# Patient Record
Sex: Female | Born: 1975 | Race: White | Marital: Single | Smoking: Current every day smoker
Health system: Southern US, Community
[De-identification: ages and names within clinical notes are randomized; demographics above are authoritative.]

## PROBLEM LIST (undated history)

## (undated) DIAGNOSIS — K2100 Gastro-esophageal reflux disease with esophagitis, without bleeding: Secondary | ICD-10-CM

## (undated) DIAGNOSIS — B354 Tinea corporis: Secondary | ICD-10-CM

## (undated) DIAGNOSIS — F32A Depression, unspecified: Secondary | ICD-10-CM

## (undated) DIAGNOSIS — D473 Essential (hemorrhagic) thrombocythemia: Secondary | ICD-10-CM

## (undated) DIAGNOSIS — F329 Major depressive disorder, single episode, unspecified: Secondary | ICD-10-CM

## (undated) DIAGNOSIS — D75839 Thrombocytosis, unspecified: Secondary | ICD-10-CM

## (undated) DIAGNOSIS — J189 Pneumonia, unspecified organism: Secondary | ICD-10-CM

## (undated) DIAGNOSIS — K449 Diaphragmatic hernia without obstruction or gangrene: Secondary | ICD-10-CM

## (undated) DIAGNOSIS — K589 Irritable bowel syndrome without diarrhea: Secondary | ICD-10-CM

## (undated) DIAGNOSIS — R51 Headache: Secondary | ICD-10-CM

## (undated) HISTORY — DX: Tinea corporis: B35.4

## (undated) HISTORY — DX: Depression, unspecified: F32.A

## (undated) HISTORY — DX: Gastro-esophageal reflux disease with esophagitis, without bleeding: K21.00

## (undated) HISTORY — DX: Major depressive disorder, single episode, unspecified: F32.9

## (undated) HISTORY — DX: Essential (hemorrhagic) thrombocythemia: D47.3

## (undated) HISTORY — DX: Gastro-esophageal reflux disease with esophagitis: K21.0

## (undated) HISTORY — DX: Thrombocytosis, unspecified: D75.839

## (undated) HISTORY — DX: Irritable bowel syndrome without diarrhea: K58.9

---

## 2010-11-13 ENCOUNTER — Other Ambulatory Visit: Payer: Self-pay | Admitting: Oncology

## 2010-11-13 ENCOUNTER — Encounter (HOSPITAL_BASED_OUTPATIENT_CLINIC_OR_DEPARTMENT_OTHER): Payer: BC Managed Care – PPO | Admitting: Oncology

## 2010-11-13 DIAGNOSIS — Z862 Personal history of diseases of the blood and blood-forming organs and certain disorders involving the immune mechanism: Secondary | ICD-10-CM

## 2010-11-13 DIAGNOSIS — D473 Essential (hemorrhagic) thrombocythemia: Secondary | ICD-10-CM

## 2010-11-13 LAB — COMPREHENSIVE METABOLIC PANEL
Albumin: 4.3 g/dL (ref 3.5–5.2)
BUN: 13 mg/dL (ref 6–23)
Calcium: 9.7 mg/dL (ref 8.4–10.5)
Chloride: 101 mEq/L (ref 96–112)
Glucose, Bld: 85 mg/dL (ref 70–99)
Potassium: 4 mEq/L (ref 3.5–5.3)

## 2010-11-13 LAB — CBC WITH DIFFERENTIAL/PLATELET
BASO%: 0.2 % (ref 0.0–2.0)
EOS%: 3.5 % (ref 0.0–7.0)
HCT: 42.5 % (ref 34.8–46.6)
LYMPH%: 26.8 % (ref 14.0–49.7)
MCH: 34 pg (ref 25.1–34.0)
MCHC: 34.4 g/dL (ref 31.5–36.0)
NEUT%: 63.3 % (ref 38.4–76.8)
RBC: 4.29 10*6/uL (ref 3.70–5.45)
lymph#: 2.5 10*3/uL (ref 0.9–3.3)

## 2010-11-13 LAB — MORPHOLOGY

## 2010-11-14 ENCOUNTER — Other Ambulatory Visit: Payer: Self-pay | Admitting: Oncology

## 2010-11-14 DIAGNOSIS — D473 Essential (hemorrhagic) thrombocythemia: Secondary | ICD-10-CM

## 2010-11-14 DIAGNOSIS — D75839 Thrombocytosis, unspecified: Secondary | ICD-10-CM

## 2010-11-17 LAB — FERRITIN: Ferritin: 576 ng/mL — ABNORMAL HIGH (ref 10–291)

## 2010-11-17 LAB — IRON AND TIBC
%SAT: 32 % (ref 20–55)
Iron: 84 ug/dL (ref 42–145)
TIBC: 266 ug/dL (ref 250–470)

## 2010-11-17 LAB — BCR/ABL (LIO MMD)

## 2010-12-12 ENCOUNTER — Other Ambulatory Visit: Payer: Self-pay | Admitting: Oncology

## 2010-12-12 ENCOUNTER — Other Ambulatory Visit (HOSPITAL_BASED_OUTPATIENT_CLINIC_OR_DEPARTMENT_OTHER): Payer: BC Managed Care – PPO

## 2010-12-12 ENCOUNTER — Other Ambulatory Visit (HOSPITAL_COMMUNITY): Payer: Self-pay | Admitting: Oncology

## 2010-12-12 DIAGNOSIS — D75839 Thrombocytosis, unspecified: Secondary | ICD-10-CM

## 2010-12-12 DIAGNOSIS — D473 Essential (hemorrhagic) thrombocythemia: Secondary | ICD-10-CM

## 2010-12-12 LAB — CBC WITH DIFFERENTIAL/PLATELET
BASO%: 0.3 % (ref 0.0–2.0)
EOS%: 3.2 % (ref 0.0–7.0)
Eosinophils Absolute: 0.4 10*3/uL (ref 0.0–0.5)
LYMPH%: 19.1 % (ref 14.0–49.7)
MCHC: 33.9 g/dL (ref 31.5–36.0)
MCV: 97.7 fL (ref 79.5–101.0)
MONO%: 4.8 % (ref 0.0–14.0)
NEUT#: 8.5 10*3/uL — ABNORMAL HIGH (ref 1.5–6.5)
Platelets: 600 10*3/uL — ABNORMAL HIGH (ref 145–400)
RBC: 4.31 10*6/uL (ref 3.70–5.45)
RDW: 12.3 % (ref 11.2–14.5)

## 2010-12-13 ENCOUNTER — Encounter: Payer: Self-pay | Admitting: Oncology

## 2010-12-13 ENCOUNTER — Other Ambulatory Visit: Payer: Self-pay | Admitting: Oncology

## 2010-12-13 DIAGNOSIS — D473 Essential (hemorrhagic) thrombocythemia: Secondary | ICD-10-CM

## 2010-12-13 DIAGNOSIS — D72829 Elevated white blood cell count, unspecified: Secondary | ICD-10-CM

## 2010-12-13 DIAGNOSIS — D75839 Thrombocytosis, unspecified: Secondary | ICD-10-CM

## 2010-12-13 NOTE — Progress Notes (Signed)
I called pt and relay to her the result of her CBC yesterday.  Her Plt is increasing further; so is her WBC.  Previous testing was negative for JAK-2 mutation and BCR/ABL.  However, given the progressive increase in her plt and WBC, I recommended diagnostic bone marrow biopsy to rule out lymphoproliferative/myleoprolierative disease.  As she is young, I recommended procedure be done by IR.  She expressed informed understanding and wished to proceed.

## 2010-12-26 ENCOUNTER — Encounter (HOSPITAL_COMMUNITY): Payer: Self-pay | Admitting: Pharmacy Technician

## 2010-12-27 ENCOUNTER — Other Ambulatory Visit: Payer: Self-pay | Admitting: Radiology

## 2010-12-28 ENCOUNTER — Encounter (HOSPITAL_COMMUNITY): Payer: Self-pay

## 2010-12-28 ENCOUNTER — Ambulatory Visit (HOSPITAL_COMMUNITY)
Admission: RE | Admit: 2010-12-28 | Discharge: 2010-12-28 | Disposition: A | Discharge status: Home / Self Care | Payer: BC Managed Care – PPO | Source: Ambulatory Visit | Attending: Oncology | Admitting: Oncology

## 2010-12-28 DIAGNOSIS — D473 Essential (hemorrhagic) thrombocythemia: Secondary | ICD-10-CM | POA: Insufficient documentation

## 2010-12-28 DIAGNOSIS — R51 Headache: Secondary | ICD-10-CM | POA: Insufficient documentation

## 2010-12-28 DIAGNOSIS — Z79899 Other long term (current) drug therapy: Secondary | ICD-10-CM | POA: Insufficient documentation

## 2010-12-28 DIAGNOSIS — D75839 Thrombocytosis, unspecified: Secondary | ICD-10-CM

## 2010-12-28 DIAGNOSIS — D72829 Elevated white blood cell count, unspecified: Secondary | ICD-10-CM | POA: Insufficient documentation

## 2010-12-28 DIAGNOSIS — R05 Cough: Secondary | ICD-10-CM | POA: Insufficient documentation

## 2010-12-28 DIAGNOSIS — R059 Cough, unspecified: Secondary | ICD-10-CM | POA: Insufficient documentation

## 2010-12-28 DIAGNOSIS — K449 Diaphragmatic hernia without obstruction or gangrene: Secondary | ICD-10-CM | POA: Insufficient documentation

## 2010-12-28 HISTORY — PX: BONE MARROW BIOPSY: SHX199

## 2010-12-28 HISTORY — DX: Headache: R51

## 2010-12-28 HISTORY — DX: Diaphragmatic hernia without obstruction or gangrene: K44.9

## 2010-12-28 HISTORY — DX: Pneumonia, unspecified organism: J18.9

## 2010-12-28 LAB — CBC
HCT: 39.6 % (ref 36.0–46.0)
Hemoglobin: 13.8 g/dL (ref 12.0–15.0)
MCHC: 34.8 g/dL (ref 30.0–36.0)
RBC: 4.2 MIL/uL (ref 3.87–5.11)

## 2010-12-28 LAB — APTT: aPTT: 32 seconds (ref 24–37)

## 2010-12-28 LAB — PROTIME-INR
INR: 0.99 (ref 0.00–1.49)
Prothrombin Time: 13.3 seconds (ref 11.6–15.2)

## 2010-12-28 MED ORDER — ONDANSETRON HCL 4 MG/2ML IJ SOLN
4.0000 mg | Freq: Four times a day (QID) | INTRAMUSCULAR | Status: DC | PRN
  Administered 2010-12-28: 4 mg via INTRAVENOUS
  Filled 2010-12-28: qty 2

## 2010-12-28 MED ORDER — SODIUM CHLORIDE 0.9 % IV SOLN
INTRAVENOUS | Status: DC
  Administered 2010-12-28: 08:00:00 via INTRAVENOUS

## 2010-12-28 MED ORDER — MIDAZOLAM HCL 5 MG/5ML IJ SOLN
INTRAMUSCULAR | Status: AC | PRN
  Administered 2010-12-28: 2 mg via INTRAVENOUS

## 2010-12-28 MED ORDER — OXYCODONE-ACETAMINOPHEN 5-325 MG PO TABS
ORAL_TABLET | ORAL | Status: AC
  Filled 2010-12-28: qty 1

## 2010-12-28 MED ORDER — FENTANYL CITRATE 0.05 MG/ML IJ SOLN
INTRAMUSCULAR | Status: AC | PRN
  Administered 2010-12-28 (×2): 100 ug via INTRAVENOUS

## 2010-12-28 MED ORDER — ONDANSETRON HCL 4 MG/2ML IJ SOLN
INTRAMUSCULAR | Status: AC
  Filled 2010-12-28: qty 2

## 2010-12-28 MED ORDER — OXYCODONE-ACETAMINOPHEN 5-325 MG PO TABS
1.0000 | ORAL_TABLET | ORAL | Status: DC | PRN
  Administered 2010-12-28: 1 via ORAL
  Filled 2010-12-28: qty 1

## 2010-12-28 NOTE — ED Notes (Signed)
Patient denies pain and is resting comfortably.  

## 2010-12-28 NOTE — H&P (Signed)
Denise Oliver is an 35 y.o. female.   Chief Complaint: " I'm here for bone marrow biopsy" HPI: Patient with history of increasing WBC and platelet counts presents today for elective CT guided bone marrow biopsy to r/o lymphoproliferative/ myeloproliferative disorder.  Past Medical History  Diagnosis Date  . Headache   . Pneumonia   . Hiatal hernia     History reviewed. No pertinent past surgical history.                                                                                      Social History:  reports that she has been smoking.  She does not have any smokeless tobacco history on file. She reports that she drinks alcohol. She reports that she does not use illicit drugs. Family History: positive for blood clots (PE/DVT) Allergies:  Allergies  Allergen Reactions  . Codeine Other (See Comments)    MAKE PT FEEL WIRED  . Pyridium (Phenazopyridine Hcl) Other (See Comments)    SEE CRAZY SUFF     Medications Prior to Admission  Medication Sig Dispense Refill  . cyclobenzaprine (FLEXERIL) 10 MG tablet Take 10 mg by mouth 3 (three) times daily as needed. MUSCLE SPASM        . ibuprofen (ADVIL,MOTRIN) 200 MG tablet Take 800 mg by mouth every 6 (six) hours as needed. PAIN         . Ibuprofen-Diphenhydramine Cit (MOTRIN PM) 200-38 MG TABS Take 2 tablets by mouth at bedtime.        Marland Kitchen venlafaxine (EFFEXOR-XR) 75 MG 24 hr capsule Take 75 mg by mouth at bedtime.        . Vitamin D, Ergocalciferol, (DRISDOL) 50000 UNITS CAPS Take 50,000 Units by mouth every 7 (seven) days. Saturday         Medications Prior to Admission  Medication Dose Route Frequency Provider Last Rate Last Dose  . 0.9 %  sodium chloride infusion   Intravenous Continuous D Jeananne Rama, PA        Results for orders placed during the hospital encounter of 12/28/10 (from the past 48 hour(s))  CBC     Status: Abnormal   Collection Time   12/28/10  7:45 AM      Component Value Range Comment   WBC 9.9  4.0 -  10.5 (K/uL)    RBC 4.20  3.87 - 5.11 (MIL/uL)    Hemoglobin 13.8  12.0 - 15.0 (g/dL)    HCT 16.1  09.6 - 04.5 (%)    MCV 94.3  78.0 - 100.0 (fL)    MCH 32.9  26.0 - 34.0 (pg)    MCHC 34.8  30.0 - 36.0 (g/dL)    RDW 40.9  81.1 - 91.4 (%)    Platelets 611 (*) 150 - 400 (K/uL)    No results found.  Review of Systems  Constitutional: Negative for fever and chills.  HENT:       Hx of migraines  Respiratory: Positive for cough. Negative for shortness of breath.        Smoker's cough  Cardiovascular: Negative for chest pain.  Gastrointestinal: Negative for nausea, vomiting and abdominal pain.  Neurological: Positive for headaches.  Endo/Heme/Allergies: Does not bruise/bleed easily.    Blood pressure 133/95, pulse 96, temperature 97.6 F (36.4 C), temperature source Oral, resp. rate 19, height 5\' 4"  (1.626 m), weight 138 lb (62.596 kg), last menstrual period 12/14/2010, SpO2 100.00%. Physical Exam  Constitutional: She is oriented to person, place, and time. She appears well-developed and well-nourished.  Cardiovascular: Normal rate and regular rhythm.        tachycardic  Respiratory: Effort normal and breath sounds normal.  GI: Soft. Bowel sounds are normal. There is no tenderness.  Musculoskeletal: Normal range of motion. She exhibits no edema.  Neurological: She is alert and oriented to person, place, and time.     Assessment/Plan Patient with history of leukocytosis/thrombocytosis; plan is for CT guided bone marrow biopsy.  Denise Oliver,D KEVIN 12/28/2010, 8:07 AM

## 2010-12-28 NOTE — Progress Notes (Signed)
1110 Nausea better up to bathroom in the room

## 2010-12-28 NOTE — Progress Notes (Signed)
1130 Dressing 2x2 applied over bandaid scant amount of bloody drainage on sheet

## 2010-12-28 NOTE — Procedures (Signed)
CT guided bone marrow biopsy.  2 aspirates and 4 core attempts.  Minimal core material collected.  No immediate complication.

## 2010-12-29 NOTE — H&P (Signed)
Agree with PA note. 

## 2011-01-15 ENCOUNTER — Telehealth: Payer: Self-pay | Admitting: *Deleted

## 2011-01-15 NOTE — Telephone Encounter (Signed)
Called pt and relayed message from Dr. Gaylyn Rong that her preliminary results negative for leukemia or lymphoma.  Instructed to call back in 2 weeks to check on results for chromosome study.  Pt verbalized understanding.

## 2011-01-15 NOTE — Telephone Encounter (Signed)
VM left by pt on Friday afternoon asking for results of Bone Marrow Bx.   Note forwarded to Dr. Gaylyn Rong for instructions.

## 2011-01-15 NOTE — Telephone Encounter (Signed)
Please call pt.  Prelim bone marrow biopsy was negative for leukemia; lymphoma.  Still waiting for chromosome study (i.e. [employer]).   Please have her call back by mid Jan 2013.  Thanks.

## 2011-01-29 ENCOUNTER — Emergency Department (HOSPITAL_COMMUNITY): Payer: BC Managed Care – PPO

## 2011-01-29 ENCOUNTER — Emergency Department (HOSPITAL_COMMUNITY)
Admission: EM | Admit: 2011-01-29 | Discharge: 2011-01-29 | Disposition: A | Discharge status: Home / Self Care | Payer: BC Managed Care – PPO | Attending: Emergency Medicine | Admitting: Emergency Medicine

## 2011-01-29 ENCOUNTER — Encounter (HOSPITAL_COMMUNITY): Payer: Self-pay | Admitting: *Deleted

## 2011-01-29 DIAGNOSIS — R109 Unspecified abdominal pain: Secondary | ICD-10-CM | POA: Insufficient documentation

## 2011-01-29 DIAGNOSIS — N201 Calculus of ureter: Secondary | ICD-10-CM | POA: Insufficient documentation

## 2011-01-29 LAB — URINALYSIS, ROUTINE W REFLEX MICROSCOPIC
Bilirubin Urine: NEGATIVE
Leukocytes, UA: NEGATIVE
Nitrite: NEGATIVE
Specific Gravity, Urine: 1.017 (ref 1.005–1.030)
Urobilinogen, UA: 0.2 mg/dL (ref 0.0–1.0)
pH: 7.5 (ref 5.0–8.0)

## 2011-01-29 LAB — COMPREHENSIVE METABOLIC PANEL
ALT: 21 U/L (ref 0–35)
AST: 15 U/L (ref 0–37)
Alkaline Phosphatase: 71 U/L (ref 39–117)
CO2: 24 mEq/L (ref 19–32)
Calcium: 9.5 mg/dL (ref 8.4–10.5)
GFR calc Af Amer: >90 mL/min (ref >90)
GFR calc non Af Amer: >90 mL/min (ref >90)
Glucose, Bld: 90 mg/dL (ref 70–99)
Potassium: 3.8 mEq/L (ref 3.5–5.1)
Sodium: 136 mEq/L (ref 135–145)

## 2011-01-29 LAB — CBC
Platelets: 567 10*3/uL — ABNORMAL HIGH (ref 150–400)
RBC: 4.33 MIL/uL (ref 3.87–5.11)
RDW: 12.3 % (ref 11.5–15.5)
WBC: 13.2 10*3/uL — ABNORMAL HIGH (ref 4.0–10.5)

## 2011-01-29 LAB — DIFFERENTIAL
Basophils Absolute: 0 10*3/uL (ref 0.0–0.1)
Eosinophils Relative: 2 % (ref 0–5)
Lymphocytes Relative: 11 % — ABNORMAL LOW (ref 12–46)
Lymphs Abs: 1.5 10*3/uL (ref 0.7–4.0)
Neutro Abs: 10.7 10*3/uL — ABNORMAL HIGH (ref 1.7–7.7)
Neutrophils Relative %: 81 % — ABNORMAL HIGH (ref 43–77)

## 2011-01-29 LAB — POCT PREGNANCY, URINE: Preg Test, Ur: NEGATIVE

## 2011-01-29 LAB — URINE MICROSCOPIC-ADD ON

## 2011-01-29 MED ORDER — ONDANSETRON HCL 4 MG/2ML IJ SOLN
4.0000 mg | Freq: Once | INTRAMUSCULAR | Status: AC
  Administered 2011-01-29: 4 mg via INTRAVENOUS
  Filled 2011-01-29: qty 2

## 2011-01-29 MED ORDER — OXYCODONE-ACETAMINOPHEN 5-325 MG PO TABS: 1.0000 | ORAL_TABLET | ORAL | Status: AC | PRN

## 2011-01-29 MED ORDER — HYDROMORPHONE HCL PF 1 MG/ML IJ SOLN
1.0000 mg | Freq: Once | INTRAMUSCULAR | Status: AC
  Administered 2011-01-29: 1 mg via INTRAVENOUS
  Filled 2011-01-29: qty 1

## 2011-01-29 MED ORDER — SODIUM CHLORIDE 0.9 % IV SOLN
Freq: Once | INTRAVENOUS | Status: AC
  Administered 2011-01-29: 09:00:00 via INTRAVENOUS

## 2011-01-29 MED ORDER — TAMSULOSIN HCL 0.4 MG PO CAPS: 0.4000 mg | ORAL_CAPSULE | Freq: Every day | ORAL | Status: AC

## 2011-01-29 MED ORDER — PROMETHAZINE HCL 25 MG PO TABS: 25.0000 mg | ORAL_TABLET | Freq: Four times a day (QID) | ORAL | Status: AC | PRN

## 2011-01-29 MED ORDER — NAPROXEN 500 MG PO TABS: 500.0000 mg | ORAL_TABLET | Freq: Two times a day (BID) | ORAL | Status: AC

## 2011-01-29 NOTE — ED Provider Notes (Signed)
History     CSN: [id number]  Arrival date & time 01/29/11  0803   First MD Initiated Contact with Patient 01/29/11 [phone]      Chief Complaint  Patient presents with  . Flank Pain  . Abdominal Pain    (Consider location/radiation/quality/duration/timing/severity/associated sxs/prior treatment) Patient is a 36 y.o. female presenting with flank pain and abdominal pain. The history is provided by the patient.  Flank Pain Associated symptoms include abdominal pain.  Abdominal Pain The primary symptoms of the illness include abdominal pain.  She noted onset at 6 AM today of left lower abdominal pain radiating to the left flank. Pain is sharp and severe. It was rated at 10 out of 10 at its worst and is 8/10 currently. Nothing makes the pain better nothing makes it worse. She denies nausea, vomiting, diarrhea, dysuria. She's not had any fever, chills, sweats. She has not taken anything to try and help the pain. She denies previous symptoms similar to this. She did have some right upper abdominal pain 3 days ago which improved after a bowel movement.  Past Medical History  Diagnosis Date  . Headache   . Pneumonia   . Hiatal hernia     Past Surgical History  Procedure Date  . Bone marrow biopsy 12/28/2010    No family history on file.  History  Substance Use Topics  . Smoking status: Current Everyday Smoker -- 12.0 packs/day  . Smokeless tobacco: Not on file  . Alcohol Use: Yes     occ.    OB History    Grav Para Term Preterm Abortions TAB SAB Ect Mult Living                  Review of Systems  Gastrointestinal: Positive for abdominal pain.  Genitourinary: Positive for flank pain.  All other systems reviewed and are negative.    Allergies  Codeine and Pyridium  Home Medications   Current Outpatient Rx  Name Route Sig Dispense Refill  . CYCLOBENZAPRINE HCL 10 MG PO TABS Oral Take 10 mg by mouth 3 (three) times daily as needed. MUSCLE SPASM      . IBUPROFEN 200  MG PO TABS Oral Take 800 mg by mouth every 6 (six) hours as needed. PAIN       . IBUPROFEN-DIPHENHYDRAMINE CIT 200-38 MG PO TABS Oral Take 2 tablets by mouth at bedtime.      . VENLAFAXINE HCL ER 75 MG PO CP24 Oral Take 75 mg by mouth at bedtime.      Marland Kitchen VITAMIN D (ERGOCALCIFEROL) 50000 UNITS PO CAPS Oral Take 50,000 Units by mouth every 7 (seven) days. Saturday        BP 120/85  Pulse 87  Temp(Src) 98 F (36.7 C) (Oral)  Resp 24  Wt 138 lb (62.596 kg)  SpO2 100%  LMP 01/08/2011  Physical Exam  Nursing note and vitals reviewed.  36 year old female is resting comfortably and in no acute distress. Vital signs are significant for mild tachypnea with respiratory rate of 24. Oxygen saturation is 100% which is normal. Head is normocephalic and atraumatic. PERRLA, EOMI. Neck is supple without adenopathy and is nontender. Back is nontender there is no CVA tenderness. Lungs are clear without rales, wheezes, rhonchi. Heart has regular rate and rhythm without murmur. Abdomen is soft, flat, with mild/moderate tenderness in the left lower quadrant without rebound or guarding. Peristalsis is diminished. Extremities have full range of motion, no cyanosis or edema. Skin is warm and moist  without rash. Neurologic: Mental status is normal, nerves are intact, there no focal motor or sensory deficits. Psychiatric: No abnormalities of mood or affect.  ED Course  Procedures (including critical care time)   Labs Reviewed  POCT PREGNANCY, URINE  URINALYSIS, ROUTINE W REFLEX MICROSCOPIC  POCT PREGNANCY, URINE   No results found. Results for orders placed during the hospital encounter of 01/29/11  URINALYSIS, ROUTINE W REFLEX MICROSCOPIC      Component Value Range   Color, Urine YELLOW  YELLOW    APPearance TURBID (*) CLEAR    Specific Gravity, Urine 1.017  1.005 - 1.030    pH 7.5  5.0 - 8.0    Glucose, UA NEGATIVE  NEGATIVE (mg/dL)   Hgb urine dipstick LARGE (*) NEGATIVE    Bilirubin Urine NEGATIVE   NEGATIVE    Ketones, ur NEGATIVE  NEGATIVE (mg/dL)   Protein, ur NEGATIVE  NEGATIVE (mg/dL)   Urobilinogen, UA 0.2  0.0 - 1.0 (mg/dL)   Nitrite NEGATIVE  NEGATIVE    Leukocytes, UA NEGATIVE  NEGATIVE   POCT PREGNANCY, URINE      Component Value Range   Preg Test, Ur NEGATIVE    CBC      Component Value Range   WBC 13.2 (*) 4.0 - 10.5 (K/uL)   RBC 4.33  3.87 - 5.11 (MIL/uL)   Hemoglobin 14.1  12.0 - 15.0 (g/dL)   HCT 16.1  09.6 - 04.5 (%)   MCV 95.2  78.0 - 100.0 (fL)   MCH 32.6  26.0 - 34.0 (pg)   MCHC 34.2  30.0 - 36.0 (g/dL)   RDW 40.9  81.1 - 91.4 (%)   Platelets 567 (*) 150 - 400 (K/uL)  DIFFERENTIAL      Component Value Range   Neutrophils Relative 81 (*) 43 - 77 (%)   Neutro Abs 10.7 (*) 1.7 - 7.7 (K/uL)   Lymphocytes Relative 11 (*) 12 - 46 (%)   Lymphs Abs 1.5  0.7 - 4.0 (K/uL)   Monocytes Relative 6  3 - 12 (%)   Monocytes Absolute 0.8  0.1 - 1.0 (K/uL)   Eosinophils Relative 2  0 - 5 (%)   Eosinophils Absolute 0.2  0.0 - 0.7 (K/uL)   Basophils Relative 0  0 - 1 (%)   Basophils Absolute 0.0  0.0 - 0.1 (K/uL)  COMPREHENSIVE METABOLIC PANEL      Component Value Range   Sodium 136  135 - 145 (mEq/L)   Potassium 3.8  3.5 - 5.1 (mEq/L)   Chloride 103  96 - 112 (mEq/L)   CO2 24  19 - 32 (mEq/L)   Glucose, Bld 90  70 - 99 (mg/dL)   BUN 13  6 - 23 (mg/dL)   Creatinine, Ser 7.82  0.50 - 1.10 (mg/dL)   Calcium 9.5  8.4 - 95.6 (mg/dL)   Total Protein 7.4  6.0 - 8.3 (g/dL)   Albumin 4.1  3.5 - 5.2 (g/dL)   AST 15  0 - 37 (U/L)   ALT 21  0 - 35 (U/L)   Alkaline Phosphatase 71  39 - 117 (U/L)   Total Bilirubin 0.2 (*) 0.3 - 1.2 (mg/dL)   GFR calc non Af Amer >90  >90 (mL/min)   GFR calc Af Amer >90  >90 (mL/min)  URINE MICROSCOPIC-ADD ON      Component Value Range   Squamous Epithelial / LPF RARE  RARE    WBC, UA 0-2  <3 (WBC/hpf)   RBC /  HPF 11-20  <3 (RBC/hpf)   Bacteria, UA MANY (*) RARE    Urine-Other MUCOUS PRESENT     Ct Abdomen Pelvis Wo  Contrast  01/29/2011  *RADIOLOGY REPORT*  Clinical Data: Left flank pain, left lower quadrant pain.  CT ABDOMEN AND PELVIS WITHOUT CONTRAST  Technique:  Multidetector CT imaging of the abdomen and pelvis was performed following the standard protocol without intravenous contrast.  Comparison: None.  Findings: Minimal dependent atelectasis in the lung bases.  No effusions.  Heart is normal size.  Liver, spleen, gallbladder, pancreas, adrenals and kidneys are unremarkable.  No renal stones or hydronephrosis.  There is a calcification along the left posterior bladder wall which is likely at the left ureteral vesicle junction or possibly just into the bladder, measuring 5 mm in length.  No evidence for hydronephrosis/obstruction on the left.  Urinary bladder, uterus, adnexa are grossly unremarkable.  Trace free fluid in the pelvis.  The is a 3.2 cm soft tissue density is noted superior to the pancreas which appears to arise from the posterior gastric fundus. On coronal imaging, this is shown to be separate from the pancreas appears to have a thin neck arising from the gastric fundus.  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 However, no air is within this structure to confirm this.  Cannot completely exclude other entity such as GI stromal tumor (GIST).  Large and small bowel are unremarkable.  No acute bony abnormality.  IMPRESSION: 5 mm left UVJ stone without evidence of hydronephrosis/obstruction.  3.2 cm soft tissue density which appears to arise off the posterior aspect of the gastric fundus.  No areas within the structure to confirm that this is definitely a diverticulum although this is favored.  I cannot completely exclude other entity such as GIST tumor.  This can be further evaluated or confirmed with CT abdomen to include oral contrast to see if this structure fills with contrast.  Original Report Authenticated By: Cyndie Chime, M.D.      1. Ureteral calculus, left    She was given a dose  of Zofran and Dilaudid with good relief of pain. Workup shows 5 mm distal ureteral calculus which would account for pain. Urinalysis shows hematuria without any significant pyuria. Bacteria is noted but not felt to be significant. ED ureteral calculus is sufficient to explain her symptoms. She is sent home with prescriptions for her in Naprosyn, Flomax, and Percocet.   MDM  Left lower quadrant pain. Differential includes renal colic, diverticulitis, and urinary tract infection. Laboratory workup and CT scan has been ordered.        Dione Booze, MD 01/29/11 [phone]

## 2011-01-29 NOTE — ED Notes (Signed)
Pt returned from CT scan.

## 2011-01-29 NOTE — ED Notes (Signed)
Patient transported to CT 

## 2011-01-29 NOTE — ED Notes (Signed)
Pt c/o left flank pain and left side abd pain, dysuria.

## 2011-02-22 ENCOUNTER — Other Ambulatory Visit: Payer: Self-pay | Admitting: Gastroenterology

## 2011-03-06 ENCOUNTER — Other Ambulatory Visit: Payer: BC Managed Care – PPO | Admitting: Lab

## 2011-04-08 ENCOUNTER — Encounter: Payer: Self-pay | Admitting: Oncology

## 2011-04-08 DIAGNOSIS — D75839 Thrombocytosis, unspecified: Secondary | ICD-10-CM | POA: Insufficient documentation

## 2011-04-08 DIAGNOSIS — D473 Essential (hemorrhagic) thrombocythemia: Secondary | ICD-10-CM | POA: Insufficient documentation

## 2011-04-11 ENCOUNTER — Ambulatory Visit: Payer: BC Managed Care – PPO | Admitting: Oncology

## 2011-04-11 ENCOUNTER — Other Ambulatory Visit: Payer: BC Managed Care – PPO | Admitting: Lab

## 2012-11-17 ENCOUNTER — Telehealth: Payer: Self-pay | Admitting: Hematology and Oncology

## 2012-11-17 NOTE — Telephone Encounter (Signed)
Left pt vm to return call in ref to np appt. °

## 2013-03-31 IMAGING — CT CT BIOPSY
1 of 5 series · 10 of 20 positions shown, 16 images · non-contrast
Comparison: none

CLINICAL HISTORY: Leukocytosis and thrombocytosis.  Evaluate for a
lymphoproliferative process.

[Series 2: [id number] · axial · 0.74mm/px · z∈[-58,-13]mm · 10 of 12 slices shown, 16 images]
[im 2/12  soft-tissue]
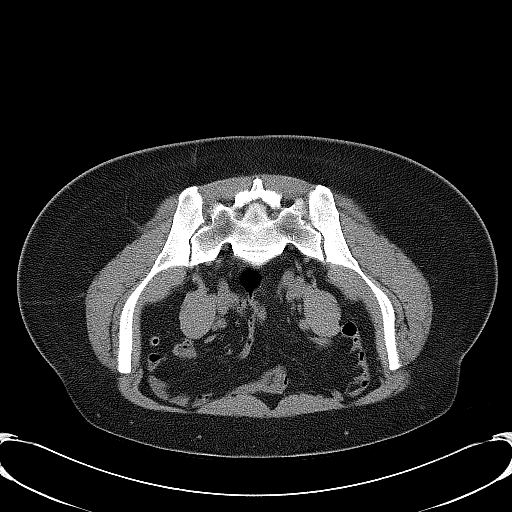
[im 2/12  bone]
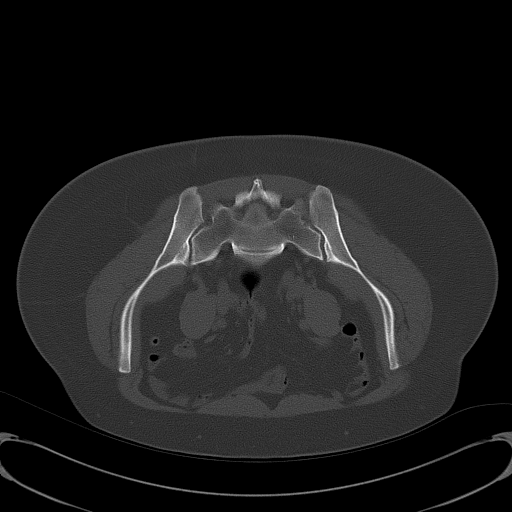
[im 3/12  soft-tissue]
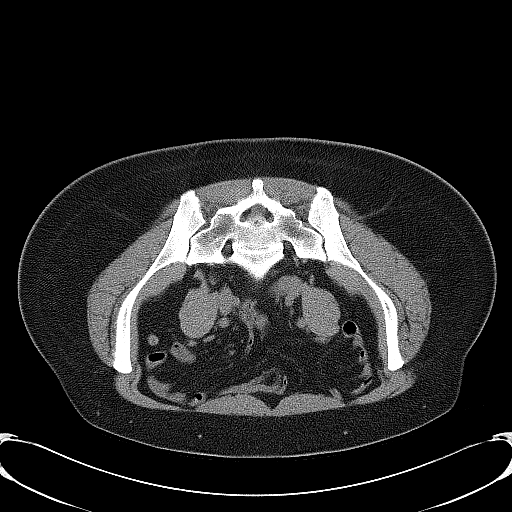
[im 4/12  soft-tissue]
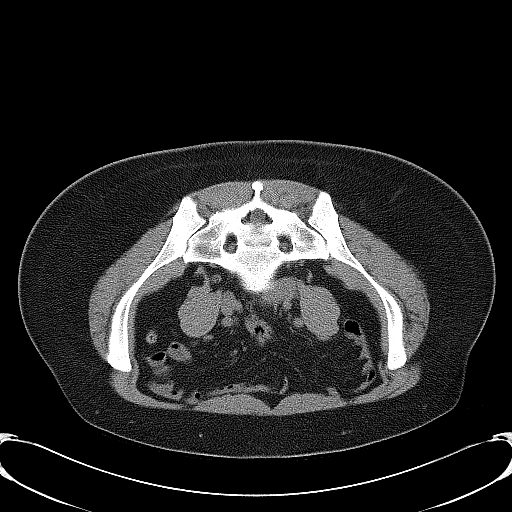
[im 5/12  soft-tissue]
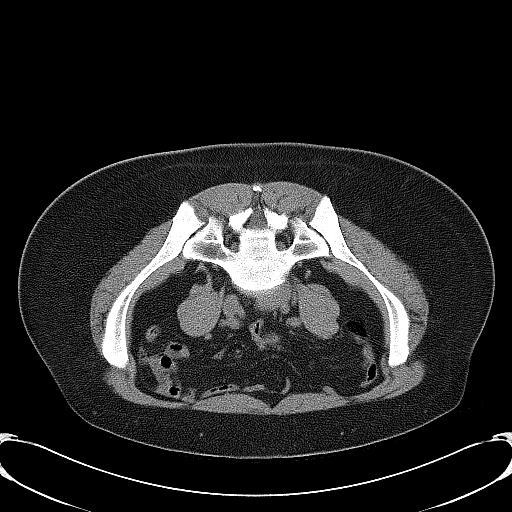
[im 6/12  soft-tissue]
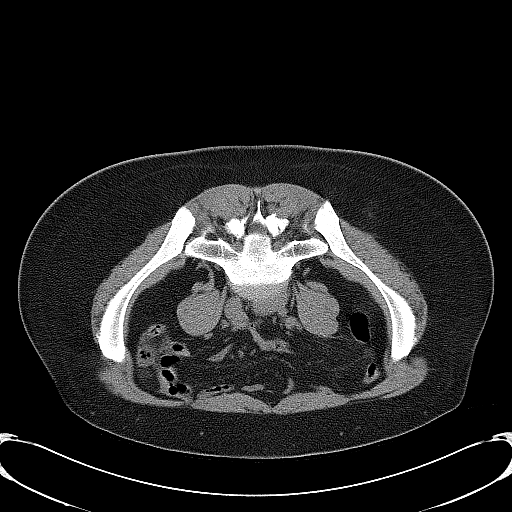
[im 7/12  soft-tissue]
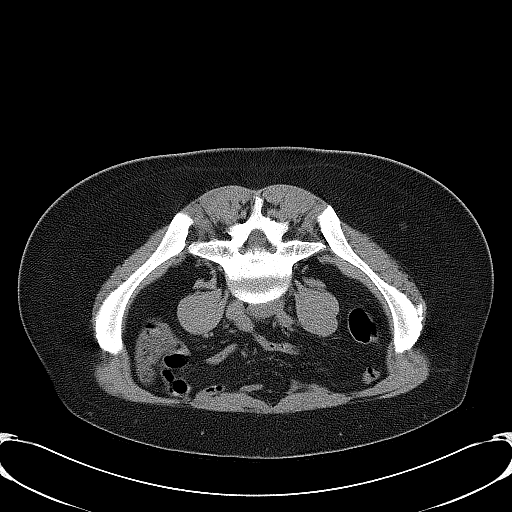
[im 8/12  soft-tissue]
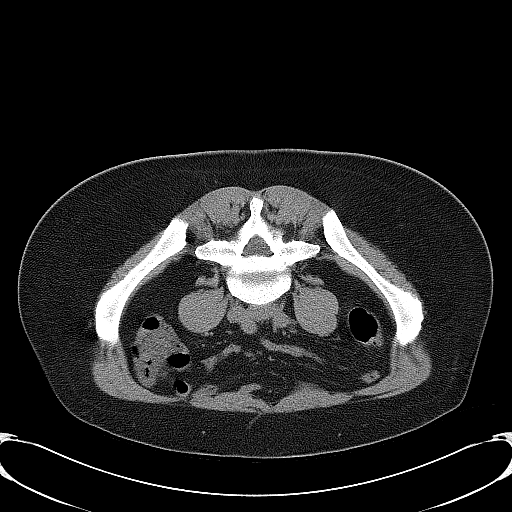
[im 8/12  lung]
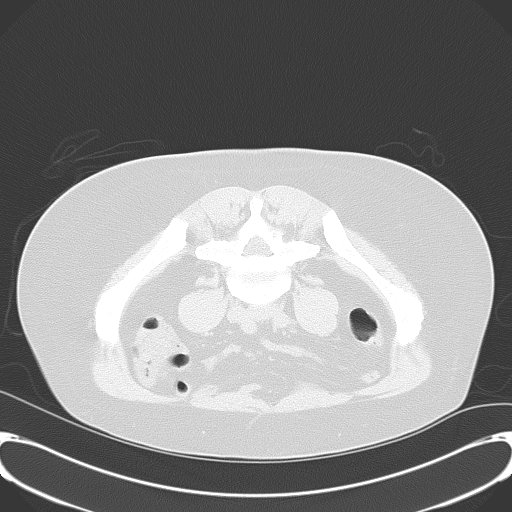
[im 9/12  soft-tissue]
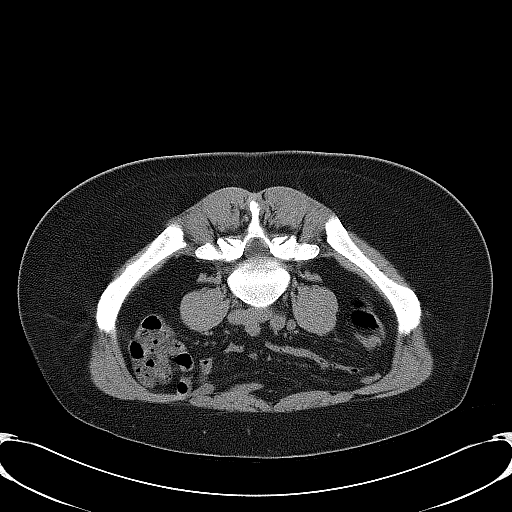
[im 9/12  lung]
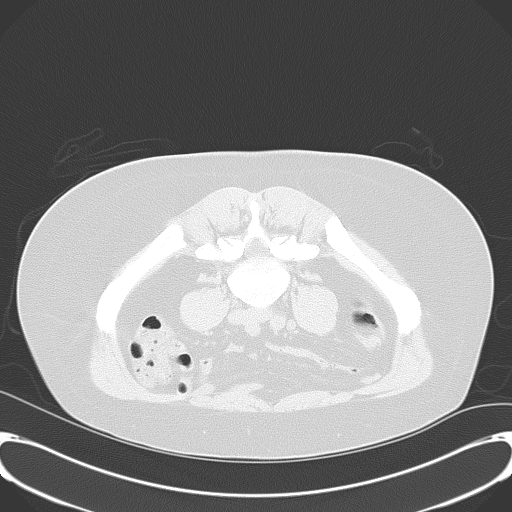
[im 10/12  soft-tissue]
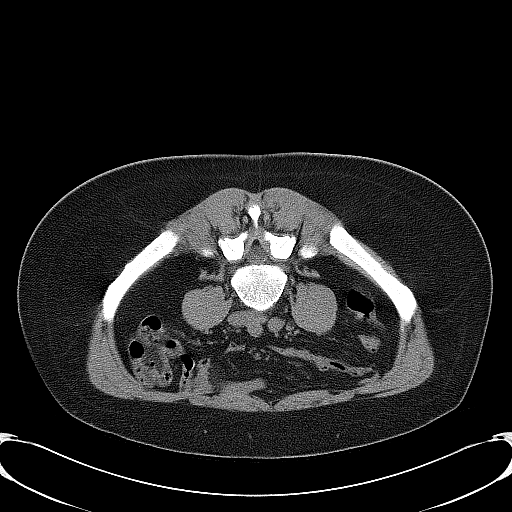
[im 10/12  lung]
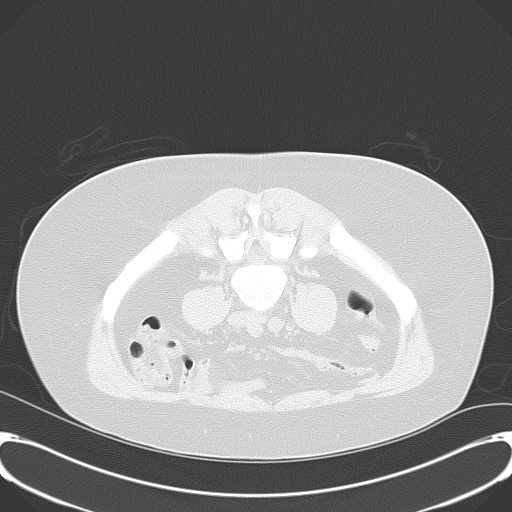
[im 10/12  bone]
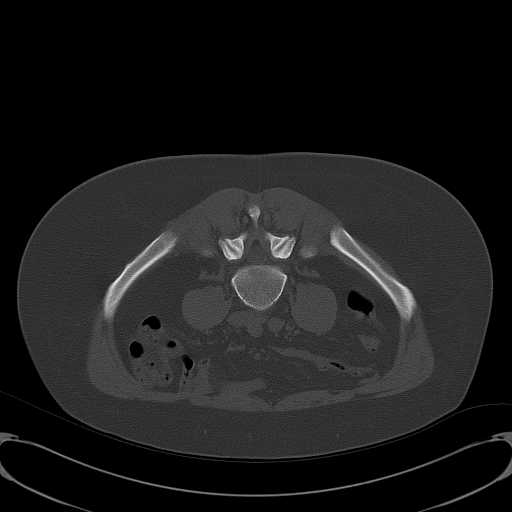
[im 11/12  soft-tissue]
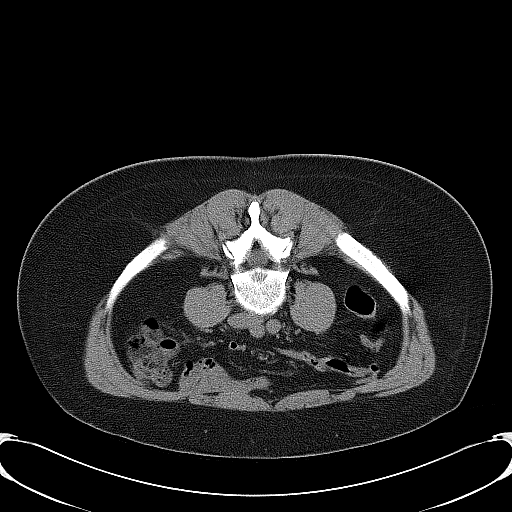
[im 11/12  lung]
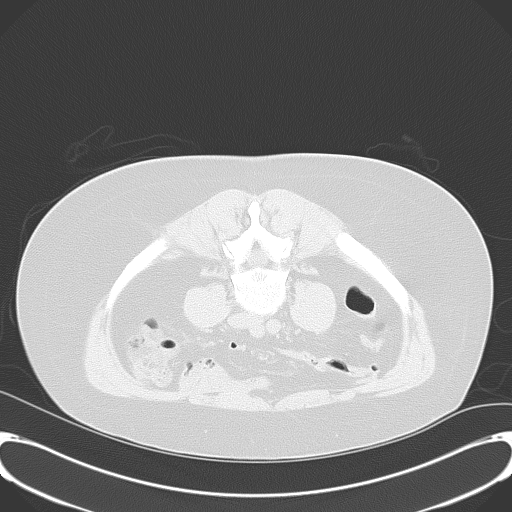

[10 of 20 positions shown; findings below may reference images not displayed]

PROCEDURE(S): CT GUIDED BONE MARROW ASPIRATE AND BIOPSY

Medications:Versed 2 mg, Fentanyl 200 mcg. A radiology nurse
monitored the patient for moderate sedation.

Moderate sedation time:30 minutes

Procedure:The procedure was explained to the patient.  The risks
and benefits of the procedure were discussed and the patient's
questions were addressed.  Informed consent was obtained from the
patient.  The patient was placed prone on the CT scanner.  Images
of the pelvis were obtained.  The right side of the back was
prepped and draped in a sterile fashion.  The skin and right
posterior iliac bone were anesthetized with 1% lidocaine.  11 gauge
bone needle was directed into the right posterior iliac bone with
CT guidance.  Two aspirates were obtained.  A total of four core
biopsies were attempted with the 11 gauge needle but minimal solid
material was obtained.
FINDINGS: Needle placement in the posterior right iliac bone.
Unable to obtain a significant amount of core material despite
multiple attempts.

Complications: None
IMPRESSION: CT guided bone marrow biopsy.

## 2013-08-25 ENCOUNTER — Telehealth: Payer: Self-pay | Admitting: Hematology and Oncology

## 2013-08-25 NOTE — Telephone Encounter (Signed)
LEFT MESSAGE FOR PATIENT AND GAVE NP APPT FOR 08/18 @ 11 W/DR. [place]

## 2013-09-01 ENCOUNTER — Ambulatory Visit: Payer: BC Managed Care – PPO | Admitting: Hematology and Oncology

## 2013-09-01 ENCOUNTER — Ambulatory Visit: Payer: BC Managed Care – PPO

## 2014-01-12 ENCOUNTER — Telehealth: Payer: Self-pay | Admitting: Hematology and Oncology

## 2014-01-12 NOTE — Telephone Encounter (Signed)
, °
